# Patient Record
Sex: Male | Born: 1975 | Race: White | Hispanic: No | Marital: Single | Smoking: Former smoker
Health system: Southern US, Community
[De-identification: ages and names within clinical notes are randomized; demographics above are authoritative.]

## PROBLEM LIST (undated history)

## (undated) HISTORY — PX: NO PAST SURGERIES: SHX2092

---

## 2003-07-08 ENCOUNTER — Other Ambulatory Visit: Payer: Self-pay

## 2018-12-27 ENCOUNTER — Encounter: Payer: Self-pay | Admitting: Emergency Medicine

## 2018-12-27 ENCOUNTER — Ambulatory Visit
Admission: EM | Admit: 2018-12-27 | Discharge: 2018-12-27 | Disposition: A | Discharge status: Home / Self Care | Payer: 59 | Attending: Emergency Medicine | Admitting: Emergency Medicine

## 2018-12-27 ENCOUNTER — Other Ambulatory Visit: Payer: Self-pay

## 2018-12-27 ENCOUNTER — Ambulatory Visit (INDEPENDENT_AMBULATORY_CARE_PROVIDER_SITE_OTHER): Payer: 59

## 2018-12-27 DIAGNOSIS — R05 Cough: Secondary | ICD-10-CM | POA: Diagnosis not present

## 2018-12-27 DIAGNOSIS — H6122 Impacted cerumen, left ear: Secondary | ICD-10-CM

## 2018-12-27 DIAGNOSIS — J45909 Unspecified asthma, uncomplicated: Secondary | ICD-10-CM | POA: Diagnosis not present

## 2018-12-27 DIAGNOSIS — R059 Cough, unspecified: Secondary | ICD-10-CM

## 2018-12-27 MED ORDER — PREDNISONE 10 MG (21) PO TBPK: ORAL_TABLET | ORAL | 1 refills | Status: AC

## 2018-12-27 MED ORDER — ALBUTEROL SULFATE HFA 108 (90 BASE) MCG/ACT IN AERS: 1.0000 | INHALATION_SPRAY | Freq: Four times a day (QID) | RESPIRATORY_TRACT | 0 refills | Status: AC | PRN

## 2018-12-27 MED ORDER — LEVOCETIRIZINE DIHYDROCHLORIDE 5 MG PO TABS: 5.0000 mg | ORAL_TABLET | Freq: Every evening | ORAL | 1 refills | Status: AC

## 2018-12-27 NOTE — ED Provider Notes (Signed)
[facility]    CSN: [id number] Arrival date & time: 12/27/18  1043     History   Chief Complaint Chief Complaint  Patient presents with  . Cough    HPI Miguel May is a 43 y.o. male presenting with shortness of breath and cough x 1 month. Pt was tested for coronavirus last week due to exposure at work and results returned negative. Pt tried to treat with OTC anti-histamines for a few days, but symptoms did not change. Pt notes that the cough and shortness of breath get worse when he lies down. No his tory of asthma or allergies.   History reviewed. No pertinent past medical history.  There are no active problems to display for this patient.   Past Surgical History:  Procedure Laterality Date  . NO PAST SURGERIES         Home Medications    Prior to Admission medications   Medication Sig Start Date End Date Taking? Authorizing Provider  albuterol (VENTOLIN HFA) 108 (90 Base) MCG/ACT inhaler Inhale 1-2 puffs into the lungs every 6 (six) hours as needed for wheezing or shortness of breath. 12/27/18   Gertie Baron, NP  levocetirizine (XYZAL) 5 MG tablet Take 1 tablet (5 mg total) by mouth every evening. 12/27/18   Gertie Baron, NP  predniSONE (STERAPRED UNI-PAK 21 TAB) 10 MG (21) TBPK tablet Take as instructed on package (60, 50, 40, 30, 20, 10) 12/27/18   Gertie Baron, NP    Family History Family History  Problem Relation Age of Onset  . Healthy Mother     Social History Social History   Tobacco Use  . Smoking status: Former Research scientist (life sciences)  . Smokeless tobacco: Never Used  Substance Use Topics  . Alcohol use: Yes  . Drug use: Not Currently     Allergies   Patient has no known allergies.   Review of Systems Review of Systems  Constitutional: Negative for activity change, appetite change, chills, diaphoresis and fatigue.  HENT: Positive for postnasal drip. Negative for congestion, sinus pain, sneezing and sore throat.   Respiratory: Positive  for cough, shortness of breath and wheezing.   Cardiovascular: Negative for chest pain.  Gastrointestinal: Negative for diarrhea, nausea and vomiting.  All other systems reviewed and are negative.    Physical Exam Triage Vital Signs ED Triage Vitals  Enc Vitals Group     BP 12/27/18 1116 (!) 145/107     Pulse Rate 12/27/18 1116 93     Resp 12/27/18 1116 18     Temp 12/27/18 1116 98.6 F (37 C)     Temp Source 12/27/18 1116 Oral     SpO2 12/27/18 1116 98 %     Weight 12/27/18 1112 175 lb (79.4 kg)     Height 12/27/18 1112 6' (1.829 m)     Head Circumference --      Peak Flow --      Pain Score 12/27/18 1112 0     Pain Loc --      Pain Edu? --      Excl. in [place]? --    No data found.  Updated Vital Signs BP (!) 142/119 (BP Location: Right Arm)   Pulse 93   Temp 98.6 F (37 C) (Oral)   Resp 18   Ht 6' (1.829 m)   Wt 175 lb (79.4 kg)   SpO2 98%   BMI 23.73 kg/m   Visual Acuity Right Eye Distance:   Left Eye Distance:  Bilateral Distance:    Right Eye Near:   Left Eye Near:    Bilateral Near:     Physical Exam Vitals signs and nursing note reviewed.  Constitutional:      Appearance: He is well-developed.  HENT:     Head: Normocephalic and atraumatic.     Left Ear: There is impacted cerumen.     Mouth/Throat:     Pharynx: Posterior oropharyngeal erythema present.  Eyes:     Conjunctiva/sclera: Conjunctivae normal.  Neck:     Musculoskeletal: Neck supple.  Cardiovascular:     Rate and Rhythm: Normal rate and regular rhythm.     Heart sounds: No murmur.  Pulmonary:     Effort: Pulmonary effort is normal. No accessory muscle usage or respiratory distress.     Breath sounds: Examination of the left-upper field reveals wheezing. Examination of the left-middle field reveals wheezing. Examination of the right-lower field reveals decreased breath sounds. Decreased breath sounds and wheezing present. No rhonchi or rales.  Abdominal:     Palpations: Abdomen is  soft.     Tenderness: There is no abdominal tenderness.  Skin:    General: Skin is warm and dry.  Neurological:     Mental Status: He is alert.      UC Treatments / Results  Labs (all labs ordered are listed, but only abnormal results are displayed) Labs Reviewed - No data to display  EKG   Radiology Dg Chest 2 View  Result Date: 12/27/2018 CLINICAL DATA:  Shortness of breath and cough for 1 month. EXAM: CHEST - 2 VIEW COMPARISON:  None. FINDINGS: The lungs are clear. Heart size is normal. No pneumothorax or pleural fluid. No acute or focal bony abnormality. IMPRESSION: Negative chest. Electronically Signed   By: Drusilla Kannerhomas  Dalessio M.D.   On: 12/27/2018 11:59    Procedures Ear Cerumen Removal  Date/Time: 12/27/2018 1:48 PM Performed by: Bailey MechBenjamin, Dereona Kolodny, NP Authorized by: Bailey MechBenjamin, Gail Creekmore, NP   Consent:    Consent obtained:  Verbal   Consent given by:  Patient   Risks discussed:  Incomplete removal and pain   Alternatives discussed:  No treatment Procedure details:    Location:  L ear   Procedure type: irrigation   Post-procedure details:    Inspection:  Bleeding and TM intact   Patient tolerance of procedure:  Tolerated well, no immediate complications   (including critical care time)  Medications Ordered in UC Medications - No data to display  Initial Impression / Assessment and Plan / UC Course  I have reviewed the triage vital signs and the nursing notes.  Pertinent labs & imaging results that were available during my care of the patient were reviewed by me and considered in my medical decision making (see chart for details).     Pt presents with cough, shortness of breath, diagnosed with cough, allergies ans asthmas and treated as such with the prescribed medications below. Pt also noted to have an impacted left ear and ear was irrigated per pt request. Pt instructed to take mediations as directed, acquire a PCP and follow-up with PCP for allergy testing and  possible manage of high blood pressure. All questions answered and all concerns addressed.    Final Clinical Impressions(s) / UC Diagnoses   Final diagnoses:  Intrinsic asthma  Cough  Left ear impacted cerumen   Discharge Instructions   None    ED Prescriptions    Medication Sig Dispense Auth. Provider   predniSONE (STERAPRED UNI-PAK 21 TAB)  10 MG (21) TBPK tablet Take as instructed on package (60, 50, 40, 30, 20, 10) 1 each Bailey MechBenjamin, Alanea Woolridge, NP   levocetirizine (XYZAL) 5 MG tablet Take 1 tablet (5 mg total) by mouth every evening. 30 tablet Bailey MechBenjamin, Armonii Sieh, NP   albuterol (VENTOLIN HFA) 108 (90 Base) MCG/ACT inhaler Inhale 1-2 puffs into the lungs every 6 (six) hours as needed for wheezing or shortness of breath. 18 g Bailey MechBenjamin, Florabelle Cardin, NP      Bailey MechLunise Devlin Brink, DNP, NP-c    Bailey MechBenjamin, Bryndle Corredor, NP 12/27/18 1350

## 2018-12-27 NOTE — ED Triage Notes (Signed)
Pt c/o cough, chest congestion, SOB (at times). Started about a month ago. Denies fever. Cough is worse at night. He was tested for COVID last week and we negative.

## 2019-01-09 ENCOUNTER — Telehealth: Payer: Self-pay | Admitting: Family Medicine

## 2019-01-09 MED ORDER — ALBUTEROL SULFATE HFA 108 (90 BASE) MCG/ACT IN AERS: 1.0000 | INHALATION_SPRAY | Freq: Four times a day (QID) | RESPIRATORY_TRACT | 0 refills | Status: AC | PRN

## 2019-01-09 NOTE — Telephone Encounter (Signed)
Rx sent 

## 2020-02-10 IMAGING — CR CHEST - 2 VIEW
2 series · 2 of 2 positions shown · non-contrast
Comparison: None.

CLINICAL DATA: Shortness of breath and cough for 1 month.

EXAM:
CHEST - 2 VIEW

[chest pa]
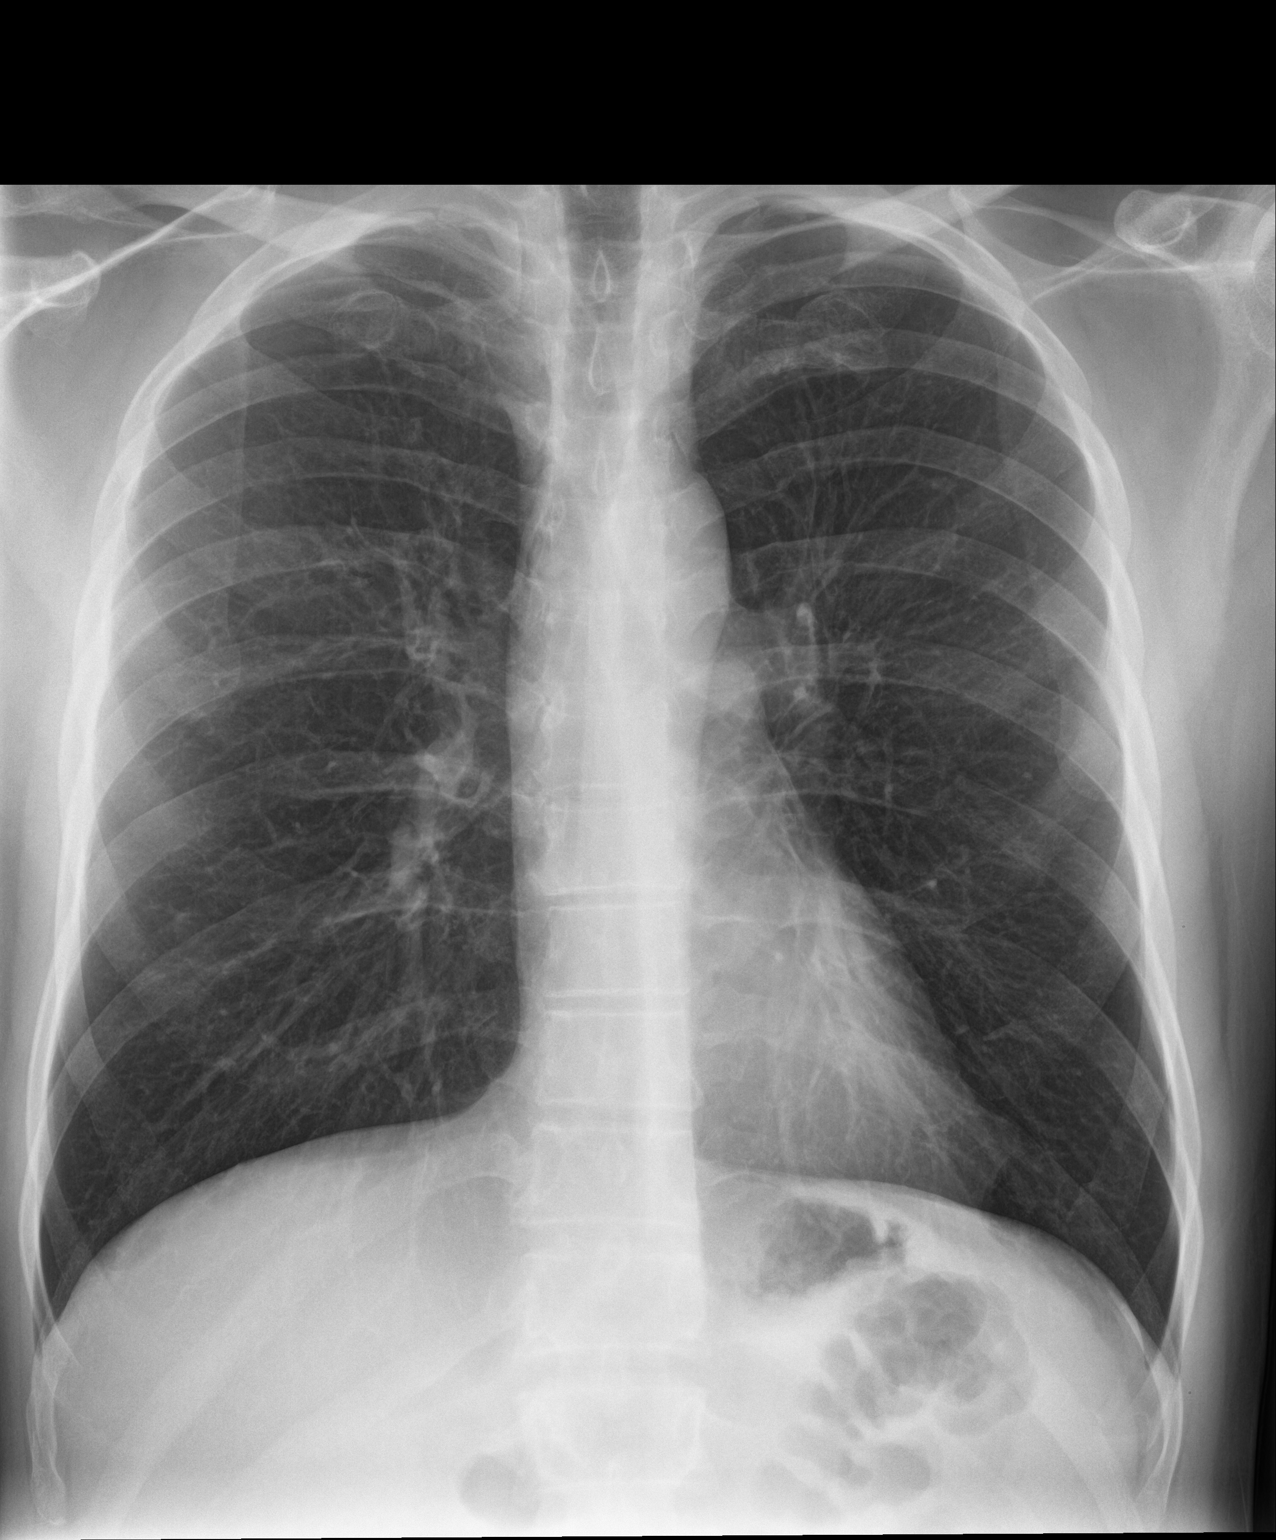

[chest lat]
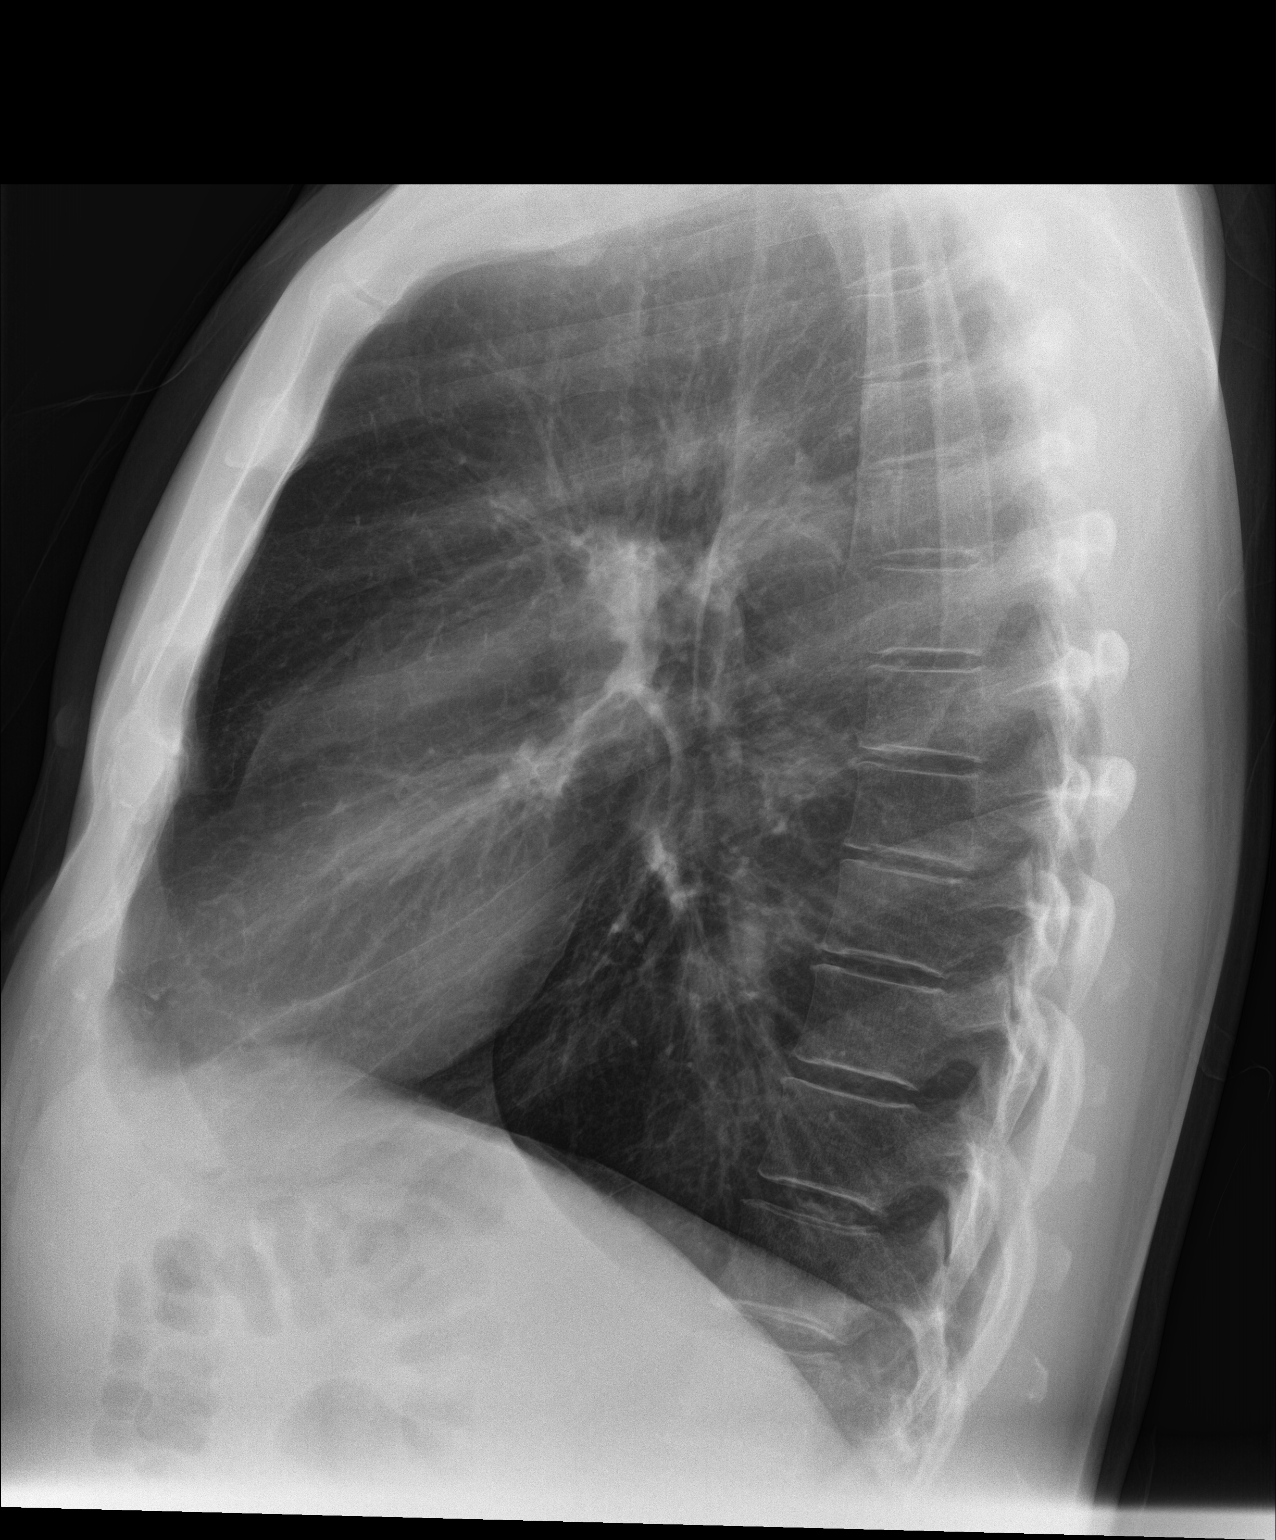

[2 of 2 positions shown; findings below may reference images not displayed]

FINDINGS: The lungs are clear. Heart size is normal. No pneumothorax or
pleural fluid. No acute or focal bony abnormality.
IMPRESSION: Negative chest.
